# Patient Record
Sex: Male | Born: 1985 | Hispanic: Yes | Marital: Married | Smoking: Current every day smoker
Health system: Southern US, Community
[De-identification: ages and names within clinical notes are randomized; demographics above are authoritative.]

## PROBLEM LIST (undated history)

## (undated) DIAGNOSIS — N200 Calculus of kidney: Secondary | ICD-10-CM

## (undated) DIAGNOSIS — K219 Gastro-esophageal reflux disease without esophagitis: Secondary | ICD-10-CM

---

## 2013-12-17 ENCOUNTER — Encounter (HOSPITAL_COMMUNITY): Payer: Self-pay | Admitting: Emergency Medicine

## 2013-12-17 ENCOUNTER — Emergency Department (HOSPITAL_COMMUNITY)
Admission: EM | Admit: 2013-12-17 | Discharge: 2013-12-18 | Disposition: A | Discharge status: Home / Self Care | Payer: Self-pay | Attending: Emergency Medicine | Admitting: Emergency Medicine

## 2013-12-17 DIAGNOSIS — S0993XA Unspecified injury of face, initial encounter: Secondary | ICD-10-CM | POA: Insufficient documentation

## 2013-12-17 DIAGNOSIS — Z8719 Personal history of other diseases of the digestive system: Secondary | ICD-10-CM | POA: Insufficient documentation

## 2013-12-17 DIAGNOSIS — S199XXA Unspecified injury of neck, initial encounter: Secondary | ICD-10-CM

## 2013-12-17 DIAGNOSIS — Y9389 Activity, other specified: Secondary | ICD-10-CM | POA: Insufficient documentation

## 2013-12-17 DIAGNOSIS — Z79899 Other long term (current) drug therapy: Secondary | ICD-10-CM | POA: Insufficient documentation

## 2013-12-17 DIAGNOSIS — Z791 Long term (current) use of non-steroidal anti-inflammatories (NSAID): Secondary | ICD-10-CM | POA: Insufficient documentation

## 2013-12-17 DIAGNOSIS — R079 Chest pain, unspecified: Secondary | ICD-10-CM | POA: Insufficient documentation

## 2013-12-17 DIAGNOSIS — S139XXA Sprain of joints and ligaments of unspecified parts of neck, initial encounter: Secondary | ICD-10-CM | POA: Insufficient documentation

## 2013-12-17 DIAGNOSIS — S161XXA Strain of muscle, fascia and tendon at neck level, initial encounter: Secondary | ICD-10-CM

## 2013-12-17 DIAGNOSIS — Y9289 Other specified places as the place of occurrence of the external cause: Secondary | ICD-10-CM | POA: Insufficient documentation

## 2013-12-17 DIAGNOSIS — F17200 Nicotine dependence, unspecified, uncomplicated: Secondary | ICD-10-CM | POA: Insufficient documentation

## 2013-12-17 DIAGNOSIS — R110 Nausea: Secondary | ICD-10-CM | POA: Insufficient documentation

## 2013-12-17 DIAGNOSIS — X503XXA Overexertion from repetitive movements, initial encounter: Secondary | ICD-10-CM | POA: Insufficient documentation

## 2013-12-17 DIAGNOSIS — R51 Headache: Secondary | ICD-10-CM | POA: Insufficient documentation

## 2013-12-17 DIAGNOSIS — X500XXA Overexertion from strenuous movement or load, initial encounter: Secondary | ICD-10-CM | POA: Insufficient documentation

## 2013-12-17 HISTORY — DX: Gastro-esophageal reflux disease without esophagitis: K21.9

## 2013-12-17 MED ORDER — IBUPROFEN 800 MG PO TABS
800.0000 mg | ORAL_TABLET | Freq: Once | ORAL | Status: AC
  Administered 2013-12-17: 800 mg via ORAL
  Filled 2013-12-17: qty 1

## 2013-12-17 MED ORDER — IBUPROFEN 800 MG PO TABS: 800.0000 mg | ORAL_TABLET | Freq: Three times a day (TID) | ORAL | Status: DC

## 2013-12-17 MED ORDER — METHOCARBAMOL 500 MG PO TABS: 500.0000 mg | ORAL_TABLET | Freq: Two times a day (BID) | ORAL | Status: AC | PRN

## 2013-12-17 NOTE — ED Notes (Addendum)
Pt alert, NAD, calm, interactive, resps e/u, speaking in clear complete sentences. C/o neck pain (pinpoints to occiput), also CP and nausea, onset 1 hr ago, "feels better now" than before, rates pain 3/10, h/o similar, no aggravating factors, (denies: nvd, fever, cough, congestion, cold sx, fever, bleeding, dizziness or other sx), admits to 4 beers 1.5 hrs ago, no meds PTA, h/o "gastric issues". Family at [facility] x2. Friend translating for pt & pt's wife.

## 2013-12-17 NOTE — Discharge Instructions (Signed)
Please call your doctor for a followup appointment within 24-48 hours. When you talk to your doctor please let them know that you were seen in the emergency department and have them acquire all of your records so that they can discuss the findings with you and formulate a treatment plan to fully care for your new and ongoing problems. ° ° °Emergency Department Resource Guide °1) Find a Doctor and Pay Out of Pocket °Although you won't have to find out who is covered by your insurance plan, it is a good idea to ask around and get recommendations. You will then need to call the office and see if the doctor you have chosen will accept you as a new patient and what types of options they offer for patients who are self-pay. Some doctors offer discounts or will set up payment plans for their patients who do not have insurance, but you will need to ask so you aren't surprised when you get to your appointment. ° °2) Contact Your Local Health Department °Not all health departments have doctors that can see patients for sick visits, but many do, so it is worth a call to see if yours does. If you don't know where your local health department is, you can check in your phone book. The CDC also has a tool to help you locate your state's health department, and many state websites also have listings of all of their local health departments. ° °3) Find a Walk-in Clinic °If your illness is not likely to be very severe or complicated, you may want to try a walk in clinic. These are popping up all over the country in pharmacies, drugstores, and shopping centers. They're usually staffed by nurse practitioners or physician assistants that have been trained to treat common illnesses and complaints. They're usually fairly quick and inexpensive. However, if you have serious medical issues or chronic medical problems, these are probably not your best option. ° °No Primary Care Doctor: °- Call [facility] at  832-8000 - they can help you  locate a primary care doctor that  accepts your insurance, provides certain services, etc. °- Physician Referral Service- 1-800-533-3463 ° °Chronic Pain Problems: °Organization         Address  Phone   Notes  °[facility]  (336) 297-2271 Patients need to be referred by their primary care doctor.  ° °Medication Assistance: °Organization         Address  Phone   Notes  °[place] [facility] [address] °[place], [place] [address] (336) 641-8030 --Must be a resident of [place] °-- Must have NO insurance coverage whatsoever (no Medicaid/ Medicare, etc.) °-- The pt. MUST have a primary care doctor that directs their care regularly and follows them in the community °  °[facility]  (866) 331-1348   °[employer]  (888) 892-1162   ° °Agencies that provide inexpensive medical care: °Organization         Address  Phone   Notes  °[facility]  (336) 832-8035   °[facility]    (336) 832-7272   °[facility] [address] °[place], [place] [address] (336) 832-4777   °[facility] [address], °[place] (336) 271-4999   °[facility]    (336) 373-0678   °[facility]    (336) 272-1050   °[facility] ° [address], [place] Phone:  (336)   832-4444, Fax:  (336) 832-4440 Hours of Operation:  9 am - 6 pm, M-F.  Also accepts Medicaid/Medicare and self-pay.  °[facility] ° [address], [place] Phone: (336) 832-3150, Fax: (336) 832-3151. Hours of Operation:  8:30 am - 5:30 pm, M-F.  Also accepts Medicaid and self-pay.  °[facility] [place] [address], [place] Phone: (336) 878-6027   °[facility] [address], [place], [place] (336)723-1848, Ext. 123 Mondays & Thursdays: 7-9 AM.  First 15 patients are seen on a first come, first serve basis. °  ° °Medicaid-accepting [place]  Providers: ° °Organization         Address  Phone   Notes  °[facility] [address], [place] (336) 641-2100 Also accepts self-pay patients.  °[facility] [address], [place] ° (336) 856-9996   °[facility] [address], [place] (336) 288-8857   °[facility] [address] [place] [address], [place] (336) 299-7000   °Veita Bland [address], [place]  ° (336) 373-1557 Only accepts [place] Access Medicaid patients after they have their name applied to their card.  ° °Self-Pay (no insurance) in [place]: ° °Organization         Address  Phone   Notes  °Sickle Cell Patients, [facility] [address], [place] (336) 832-1970   °[facility] [address], [place] (336) 832-4400   °[facility] ° [address], [place] (336) 992-4800   °[facility]/Dr. Osei-Bonsu ° [address] [place] [address], [place] or [address], [place] (336) 841-8500 Phone number for both [place] and [place] locations is the same.  °[facility] [address], [place] (336) 299-0000   °[facility] [address] [place] [address], [place] or [address] (336) 852-7530 °(336) 878-2260   °[facility] [address], [place] (336) 350-1642, phone; (336) 294-5005, fax Sees patients 1st and 3rd Saturday of every month.  Must not qualify for public or private insurance (i.e. Medicaid, Medicare, [place] Health Choice, Veterans' Benefits) • Household income should be no more than 200% of the poverty level •The clinic cannot treat you if you are pregnant or think you are pregnant • Sexually transmitted diseases are not treated at the clinic.  ° ° °Dental Care: °Organization         Address  Phone  Notes  °[facility] [address], [place] (336) 641-6152 Accepts children up to age 21 who are enrolled in Medicaid or [place] Health Choice; pregnant women with a Medicaid card; and children who have applied for Medicaid or [place] Health Choice, but were declined, whose parents can pay a reduced fee at time of service.  °[facility] [place]  [address], [place] (336) 641-7733 Accepts children up to age 21 who are enrolled in Medicaid or [place] Health Choice; pregnant women with a Medicaid card; and children who have applied for Medicaid or [place] Health Choice, but were declined, whose parents can pay a reduced fee at time of service.  °[facility] ° [address],  (336) 641-4533 Patients are seen by appointment only. Walk-ins are   not accepted. [facility] will see patients 18 years of age and older. °Monday - Tuesday (8am-5pm) °Most Wednesdays (8:30-5pm) °$30 per visit, cash only  °[facility] ° [address], [place] (336) 641-4533 Patients are seen by appointment only. Walk-ins are not accepted. [facility] will see patients 18 years of age and older. °One Wednesday Evening (Monthly: Volunteer Based).  $30 per visit, cash only  °[facility]  (919) 537-3737 for adults; Children under age 4, call Graduate Pediatric Dentistry at (919) [phone]. Children aged 4-14, please call (919) [phone] to request a pediatric application. ° Dental services are provided in all areas of dental care including fillings, crowns and bridges, complete and partial dentures, implants, gum treatment, root canals, and extractions. Preventive care is also provided. Treatment is provided to both adults and children. °Patients are selected via a lottery and there is often a waiting list. °  °[facility] [address], °[place] ° (336) 763-8833 [email or web address] °  °[facility]  [address], [place], [place] (336)723-1848, Ext. 123 Second and Fourth Thursday of each month, opens at 6:30 AM; Clinic ends at 9 AM.  Patients are seen on a first-come first-served basis, and a limited number are seen during each clinic.  ° °[facility] ° [address], [place], [place] (336) 723-7904   Eligibility Requirements °You must have lived in [place], [place], or [place] counties for at least the last three months. °  You cannot be eligible for state or federal sponsored healthcare insurance, including Veterans Administration, Medicaid, or Medicare. °  You generally cannot be eligible for healthcare insurance through your employer.  °  How to apply: °Eligibility screenings are held every Tuesday and Wednesday afternoon from 1:00 pm until 4:00 pm. You do not need an appointment for the interview!  °[facility] [address], [place], [place] 336-631-2330   °[facility]  336-342-8273   °[place] [facility]  336-703-3100   °[facility]  336-570-6415   ° °Behavioral Health Resources in the Community: °Intensive Outpatient Programs °Organization         Address  Phone  Notes  °[facility] [address], [place], [place] 336-878-6098   °[facility] [address], [place], [place] 336-832-9800   °[facility] [address], [place], [place] ° 336-882-2125   °[facility] [address],  °[place], [place] 1-800-853-5163 or 336-641-4981   °Substance Abuse Resources °Organization         Address  Phone  Notes  °[facility]  336-882-2125   °[facility]  336-784-9470   °[facility]  336-285-9073   °[facility]  336-845-3988   °Residential & Outpatient Substance Abuse Program  1-800-659-3381   °Psychological Services °Organization         Address  Phone  Notes  °[facility]  336- 832-9600     °[facility]  336- 378-7881   °[facility] [address], [place] 1-800-853-5163 or 336-641-4981   ° °Mobile Crisis Teams °Organization         Address  Phone  Notes  °[facility], Mobile Crisis Care Unit  1-877-626-1772   °Assertive °[facility] ° [address] [place], [place] 336-834-9664   °Sharon DeEsch [address] °[place] 336-554-5454   ° °  Self-Help/Support Groups °Organization         Address  Phone             Notes  °[facility] [place] - variety of support groups  336- 373-1402 Call for more information  °Narcotics Anonymous (NA), [facility] [address], °[place]  2 meetings at this location  ° °Residential Treatment Programs °Organization         Address  Phone  Notes  °[facility] [address],    °[place]  1-866-801-8205   °[facility] ° [address], [place], [place] 704-293-8524   °[facility] [address], [place] 336-845-3988 Admissions: 8am-3pm M-F  °[facility] [address]    °[place], [place] 336-841-1104   °[facility] [address], [place], [place] 336-379-7146   °[facility] [address]  °[place], [place] 336-285-9073   °[facility] - Intensive Outpatient [address], [place], [place] 336-852-3033   °[facility] [address]  °[place], [place] 1-877-615-2722 or 336-784-9470   °[facility] [address] [place], [place] 336-227-7417 Accepts Medicaid  °[facility] [address]  °[place] 1-800-659-3381 Substance Abuse/Addiction Treatment  ° °[place] Behavioral Health Resources °Organization         Address  Phone  Notes  °[facility]  (888) 581-9988   °Julie Brannon, PhD [address] [place], [place]   (336) 349-5553 or (336) 951-0000    °[facility]   [address] °[place], [place] (336) 349-4454   °[facility] [address], [place], [place] (336) 342-8316 Insurance/Medicaid/sponsorship through [facility]  °[facility] [address]                                    [place], [place] (336) 342-8316 Therapy/tele-psych/case  °[facility] [address]  ° [place], [place] (336) 349-2233    °Dr. Arfeen  (336) 349-4544   °[facility] [place]  [employer] [place] [facility] 1) [address], [place] °2) [address], [place] °3)  [address], [place] (336) 349-3220 °(336) 342-7768 ° °(336) 342-8140   °[place] [facility] (336) 342-1394 or (336) 342-3537 (After Hours)    ° ° ° °

## 2013-12-17 NOTE — ED Provider Notes (Signed)
CSN: [id number]     Arrival date & time 12/17/13  2302 History   First MD Initiated Contact with Patient 12/17/13 2319     Chief Complaint  Patient presents with  . Headache  . Neck Pain  . Chest Pain  . Nausea     (Consider location/radiation/quality/duration/timing/severity/associated sxs/prior Treatment) HPI Comments: 28-year-old male, no past medical history, no past surgical history who presents with a complaint of neck pain. He states this started when he got home from work this evening. 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 He states that today was a particularly long day with lots of work, he feels the pain is in his posterior neck, posterior scalp and goes up to the crown of the head. There is no pain to the front of the head or the temporal areas and no pain to the sides of the neck. This is made worse with palpation of the back of the scalp and the neck. There is no associated numbness weakness or difficulty ambulating and he states that he has a very small amount of left-sided chest tightness which he says is only associated with his neck pain. There is no coughing, shortness of breath, fever, chills and has no difficulty ambulating. No problems with using his arms or legs, no difficulty with sensation. He has had no medication prior to arrival.  Patient is a 28 y.o. male presenting with headaches, neck pain, and chest pain. The history is provided by the patient and a relative.  Headache Associated symptoms: neck pain   Neck Pain Associated symptoms: chest pain and headaches   Chest Pain Associated symptoms: headache     Past Medical History  Diagnosis Date  . Acid reflux    History reviewed. No pertinent past surgical history. History reviewed. No pertinent family history. History  Substance Use Topics  . Smoking status: Current  Some Day Smoker  . Smokeless tobacco: Not on file  . Alcohol Use: Yes    Review of Systems  Cardiovascular: Positive for chest pain.  Musculoskeletal: Positive for neck pain.  Neurological: Positive for headaches.  All other systems reviewed and are negative.     Allergies  Review of patient's allergies indicates no known allergies.  Home Medications   Prior to Admission medications   Medication Sig Start Date End Date Taking? Authorizing Provider  ibuprofen (ADVIL,MOTRIN) 800 MG tablet Take 1 tablet (800 mg total) by mouth 3 (three) times daily. 12/17/13   Vida Roller, MD  methocarbamol (ROBAXIN) 500 MG tablet Take 1 tablet (500 mg total) by mouth 2 (two) times daily as needed for muscle spasms. 12/17/13   Vida Roller, MD   BP 115/74  Pulse 71  Temp(Src) 98.1 F (36.7 C) (Oral)  Resp 16  SpO2 100% Physical Exam  Nursing note and vitals reviewed. Constitutional: He appears well-developed and well-nourished. No distress.  HENT:  Head: Normocephalic and atraumatic.  Mouth/Throat: Oropharynx is clear and moist. No oropharyngeal exudate.  Eyes: Conjunctivae and EOM are normal. Pupils are equal, round, and reactive to light. Right eye exhibits no discharge. Left eye exhibits no discharge. No scleral icterus.  Neck: Normal range of motion. Neck supple. No JVD present. No thyromegaly present.  Cardiovascular: Normal rate, regular rhythm, normal heart sounds and intact distal pulses.  Exam reveals  no gallop and no friction rub.   No murmur heard. Pulmonary/Chest: Effort normal and breath sounds normal. No respiratory distress. He has no wheezes. He has no rales.  Abdominal: Soft. Bowel sounds are normal. He exhibits no distension and no mass. There is no tenderness.  Musculoskeletal: Normal range of motion. He exhibits tenderness. He exhibits no edema.  Tender to palpation in the posterior occiput and the posterior upper cervical neck, no bony tenderness of the cervical spine   Lymphadenopathy:    He has no cervical adenopathy.  Neurological: He is alert. Coordination normal.  Normal strength and sensation in all 4 extremities. Cranial nerves III through XII intact  Skin: Skin is warm and dry. No rash noted. No erythema.  Psychiatric: He has a normal mood and affect. His behavior is normal.    ED Course  Procedures (including critical care time) Labs Review Labs Reviewed - No data to display  Imaging Review No results found.   EKG Interpretation   Date/Time:  Sunday December 17 2013 23:04:24 EDT Ventricular Rate:  76 PR Interval:  162 QRS Duration: 92 QT Interval:  396 QTC Calculation: 445 R Axis:   86 Text Interpretation:  Normal sinus rhythm Nonspecific T wave abnormality  lead 3. ECG OTHERWISE WITHIN NORMAL LIMITS No old tracing to compare  Confirmed by [facility]  MD, Jacquilyn Seldon ([id number]) on 12/17/2013 11:20:38 PM      MDM   Final diagnoses:  Strain of neck muscle, initial encounter    The patient is well-appearing, the EKG is rather unremarkable, patient's story is not consistent with cardiac disease but rather more consistent with having a strain of his neck muscles. Medications given as below, stable for discharge.  Meds given in ED:  Medications  ibuprofen (ADVIL,MOTRIN) tablet 800 mg (not administered)    New Prescriptions   IBUPROFEN (ADVIL,MOTRIN) 800 MG TABLET    Take 1 tablet (800 mg total) by mouth 3 (three) times daily.   METHOCARBAMOL (ROBAXIN) 500 MG TABLET    Take 1 tablet (500 mg total) by mouth 2 (two) times daily as needed for muscle spasms.        Vida Roller, MD 12/17/13 [phone]

## 2013-12-17 NOTE — ED Notes (Signed)
Dr. Hyacinth Meeker EDP in to see pt.

## 2014-06-20 ENCOUNTER — Encounter (HOSPITAL_COMMUNITY): Payer: Self-pay | Admitting: *Deleted

## 2014-06-20 ENCOUNTER — Emergency Department (HOSPITAL_COMMUNITY)
Admission: EM | Admit: 2014-06-20 | Discharge: 2014-06-20 | Disposition: A | Discharge status: Home / Self Care | Payer: Self-pay | Attending: Emergency Medicine | Admitting: Emergency Medicine

## 2014-06-20 ENCOUNTER — Emergency Department (HOSPITAL_COMMUNITY): Payer: Self-pay

## 2014-06-20 DIAGNOSIS — R509 Fever, unspecified: Secondary | ICD-10-CM | POA: Insufficient documentation

## 2014-06-20 DIAGNOSIS — R109 Unspecified abdominal pain: Secondary | ICD-10-CM

## 2014-06-20 DIAGNOSIS — Z720 Tobacco use: Secondary | ICD-10-CM | POA: Insufficient documentation

## 2014-06-20 DIAGNOSIS — R1030 Lower abdominal pain, unspecified: Secondary | ICD-10-CM | POA: Insufficient documentation

## 2014-06-20 DIAGNOSIS — Z87442 Personal history of urinary calculi: Secondary | ICD-10-CM | POA: Insufficient documentation

## 2014-06-20 DIAGNOSIS — R51 Headache: Secondary | ICD-10-CM | POA: Insufficient documentation

## 2014-06-20 HISTORY — DX: Calculus of kidney: N20.0

## 2014-06-20 LAB — COMPREHENSIVE METABOLIC PANEL
ALK PHOS: 87 U/L (ref 39–117)
ALT: 39 U/L (ref 0–53)
AST: 34 U/L (ref 0–37)
Albumin: 4.3 g/dL (ref 3.5–5.2)
Anion gap: 3 — ABNORMAL LOW (ref 5–15)
BUN: 12 mg/dL (ref 6–23)
CO2: 26 mmol/L (ref 19–32)
Calcium: 8.7 mg/dL (ref 8.4–10.5)
Chloride: 108 mmol/L (ref 96–112)
Creatinine, Ser: 0.81 mg/dL (ref 0.50–1.35)
GLUCOSE: 103 mg/dL — AB (ref 70–99)
POTASSIUM: 3.5 mmol/L (ref 3.5–5.1)
Sodium: 137 mmol/L (ref 135–145)
Total Bilirubin: 0.7 mg/dL (ref 0.3–1.2)
Total Protein: 6.7 g/dL (ref 6.0–8.3)

## 2014-06-20 LAB — CBC WITH DIFFERENTIAL/PLATELET
BASOS ABS: 0 10*3/uL (ref 0.0–0.1)
Basophils Relative: 0 % (ref 0–1)
Eosinophils Absolute: 0.1 10*3/uL (ref 0.0–0.7)
Eosinophils Relative: 1 % (ref 0–5)
HCT: 37.3 % — ABNORMAL LOW (ref 39.0–52.0)
HEMOGLOBIN: 12.7 g/dL — AB (ref 13.0–17.0)
LYMPHS ABS: 1.7 10*3/uL (ref 0.7–4.0)
Lymphocytes Relative: 12 % (ref 12–46)
MCH: 27.9 pg (ref 26.0–34.0)
MCHC: 34 g/dL (ref 30.0–36.0)
MCV: 82 fL (ref 78.0–100.0)
MONOS PCT: 9 % (ref 3–12)
Monocytes Absolute: 1.4 10*3/uL — ABNORMAL HIGH (ref 0.1–1.0)
Neutro Abs: 11.5 10*3/uL — ABNORMAL HIGH (ref 1.7–7.7)
Neutrophils Relative %: 78 % — ABNORMAL HIGH (ref 43–77)
Platelets: 177 10*3/uL (ref 150–400)
RBC: 4.55 MIL/uL (ref 4.22–5.81)
RDW: 13.6 % (ref 11.5–15.5)
WBC: 14.7 10*3/uL — ABNORMAL HIGH (ref 4.0–10.5)

## 2014-06-20 LAB — URINALYSIS, ROUTINE W REFLEX MICROSCOPIC
BILIRUBIN URINE: NEGATIVE
Glucose, UA: NEGATIVE mg/dL
HGB URINE DIPSTICK: NEGATIVE
KETONES UR: NEGATIVE mg/dL
Leukocytes, UA: NEGATIVE
Nitrite: NEGATIVE
PH: 5.5 (ref 5.0–8.0)
Protein, ur: NEGATIVE mg/dL
SPECIFIC GRAVITY, URINE: 1.03 (ref 1.005–1.030)
Urobilinogen, UA: 0.2 mg/dL (ref 0.0–1.0)

## 2014-06-20 MED ORDER — ONDANSETRON HCL 4 MG/2ML IJ SOLN: 4.0000 mg | Freq: Once | INTRAMUSCULAR | Status: DC

## 2014-06-20 MED ORDER — OXYCODONE-ACETAMINOPHEN 5-325 MG PO TABS
1.0000 | ORAL_TABLET | Freq: Once | ORAL | Status: AC
  Administered 2014-06-20: 1 via ORAL
  Filled 2014-06-20: qty 1

## 2014-06-20 MED ORDER — MORPHINE SULFATE 4 MG/ML IJ SOLN: 4.0000 mg | Freq: Once | INTRAMUSCULAR | Status: DC

## 2014-06-20 MED ORDER — NAPROXEN 500 MG PO TABS: 500.0000 mg | ORAL_TABLET | Freq: Two times a day (BID) | ORAL | Status: DC

## 2014-06-20 MED ORDER — KETOROLAC TROMETHAMINE 30 MG/ML IJ SOLN: 30.0000 mg | Freq: Once | INTRAMUSCULAR | Status: DC

## 2014-06-20 NOTE — ED Notes (Signed)
Pt reports lower right flank pain for a few days with no urinary symptoms.  Pt has hx of kidney stones. Pt alert and oriented.

## 2014-06-20 NOTE — Discharge Instructions (Signed)
Su diagnstico diferencial de hoy no mostr una causa de su dolor unilateral derecha. No hay infecciones, clculos renales, o cualquier otra anomala se observaron en el TAC. Su orina y el trabajo de laboratorio tambin fue normal. Tomar la medicacin segn sea necesario para Chief Technology Officerel dolor. Dolor en el flanco  (Flank Pain) El dolor en el flanco se refiere a aquel dolor que se localiza en un lado del cuerpo, entre la zona superior del abdomen y Scientific laboratory technicianla espalda. Puede aparecer en un perodo corto de tiempo (agudo) o puede durar mucho tiempo o ser recurrente (crnico). Puede ser leve o intenso. Puede tener diferentes causas.  CAUSAS  Algunas de las causas ms comunes son:   Esguince muscular.   Espasmos musculares.   Problemas en la columna vertebral (enfermedad de los discos).   Infeccin en el pulmn (neumona).   Lquido en los pulmones (edema pulmonar).   Infeccin renal.   Piedras en el rin.   Una erupcin cutnea muy dolorosa causada por el virus de la varicela (herpes zoster).  Enfermedad de la vescula biliar. INSTRUCCIONES PARA EL CUIDADO EN EL HOGAR  Los cuidados en el hogar dependern de la causa del dolor. En general:   Haga reposo segn las indicaciones del mdico.  Debe ingerir gran cantidad de lquido para mantener la orina de tono claro o color amarillo plido.  Tome slo medicamentos de venta libre o recetados, segn las indicaciones del mdico. Algunos medicamentos ayudan a Engineer, materialsaliviar el dolor.  Informe al mdico si tiene algn Arboriculturistcambio en el dolor.  Concurra a las visitas de control, segn las indicaciones. SOLICITE ATENCIN MDICA DE INMEDIATO SI:   El dolor no se alivia con los United Parcelmedicamentos.   Tiene sntomas nuevos o que empeoran.  El dolor Iredellaumenta.   Siente dolor abdominal.   Le falta el aire.   Tiene nuseas o vmitos persistentes.   El abdomen se hincha.   Sufre mareos o se desmaya.   Observa sangre en la orina.  Tiene fiebre o sntomas  persistentes durante ms de 2  3 das.  Tiene fiebre y los sntomas empeoran repentinamente. ASEGRESE DE QUE:   Comprende estas instrucciones.  Controlar su enfermedad.  Solicitar ayuda de inmediato si no mejora o si empeora. Document Released: 07/14/2007 Document Revised: 12/30/2011 [facility] Patient Information 2015 [place], [place]. This information is not intended to replace advice given to you by your health care provider. Make sure you discuss any questions you have with your health care provider.

## 2014-06-20 NOTE — ED Notes (Signed)
Patient presents with c/o right flank pain, decreased urination

## 2014-06-20 NOTE — ED Provider Notes (Signed)
CSN: [id number]     Arrival date & time 06/20/14  0002 History  This chart was scribed for Olivia Mackielga M Yaasir Menken, MD by Annye AsaAnna Dorsett, ED Scribe. This patient was seen in room A06C/A06C and the patient's care was started at 12:51 AM.    Chief Complaint  Patient presents with  . Flank Pain   Patient is a 29 y.o. male presenting with flank pain. The history is provided by the patient. No language interpreter was used.  Flank Pain Associated symptoms include headaches.     HPI Comments: Drew Austin is a 29 y.o. male who presents to the Emergency Department complaining of 1 day of waxing and waning right-sided flank pain, currently rated 6/10. This pain began suddenly earlier tonight; it is described as "sharp." He also notes subjective fever and headache. He denies hematuria, nausea. He took two Advil for pain with mild relief. Patient reports prior experience with similar symptoms; he explains he had kidney stones at that time (last episode 5 years PTA, no prior related surgeries).   Past Medical History  Diagnosis Date  . Kidney stone    History reviewed. No pertinent past surgical history. No family history on file. History  Substance Use Topics  . Smoking status: Current Every Day Smoker  . Smokeless tobacco: Never Used  . Alcohol Use: No    Review of Systems  Constitutional: Positive for fever. Fatigue: Subjective.  Gastrointestinal: Negative for nausea.  Genitourinary: Positive for flank pain. Negative for hematuria.  Neurological: Positive for headaches.  All other systems reviewed and are negative.  Allergies  Review of patient's allergies indicates not on file.  Home Medications   Prior to Admission medications   Not on File   BP 108/70 mmHg  Pulse 91  Temp(Src) 98.7 F (37.1 C) (Oral)  Resp 20  Ht 5\' 10"  (1.778 m)  Wt 178 lb 6.4 oz (80.922 kg)  BMI 25.60 kg/m2  SpO2 96% Physical Exam  Constitutional: He is oriented to person, place, and time. He  appears well-developed and well-nourished. No distress.  HENT:  Head: Normocephalic and atraumatic.  Mouth/Throat: Oropharynx is clear and moist. No oropharyngeal exudate.  Eyes: EOM are normal. Pupils are equal, round, and reactive to light.  Neck: Normal range of motion. Neck supple.  Cardiovascular: Normal rate, regular rhythm and normal heart sounds.  Exam reveals no gallop and no friction rub.   No murmur heard. Pulmonary/Chest: Effort normal. No respiratory distress. He has no wheezes. He has no rales.  Abdominal: Soft. There is no tenderness. There is no rebound and no guarding.  Musculoskeletal: Normal range of motion. He exhibits no edema.  Neurological: He is alert and oriented to person, place, and time.  Skin: Skin is warm and dry. No rash noted.  Psychiatric: He has a normal mood and affect. His behavior is normal.  Nursing note and vitals reviewed.   ED Course  Procedures   DIAGNOSTIC STUDIES: Oxygen Saturation is 96% on RA, adequate by my interpretation.    COORDINATION OF CARE: 12:56 AM Discussed treatment plan with pt at bedside and pt agreed to plan.   Labs Review Labs Reviewed  URINALYSIS, ROUTINE W REFLEX MICROSCOPIC - Abnormal; Notable for the following:    APPearance CLOUDY (*)    All other components within normal limits  CBC WITH DIFFERENTIAL/PLATELET - Abnormal; Notable for the following:    WBC 14.7 (*)    Hemoglobin 12.7 (*)    HCT 37.3 (*)  Neutrophils Relative % 78 (*)    Neutro Abs 11.5 (*)    Monocytes Absolute 1.4 (*)    All other components within normal limits  COMPREHENSIVE METABOLIC PANEL - Abnormal; Notable for the following:    Glucose, Bld 103 (*)    Anion gap 3 (*)    All other components within normal limits    Imaging Review Ct Renal Stone Study  06/20/2014   CLINICAL DATA:  Right-sided flank pain and right lower quadrant pain since yesterday  EXAM: CT ABDOMEN AND PELVIS WITHOUT CONTRAST  TECHNIQUE: Multidetector CT imaging of  the abdomen and pelvis was performed following the standard protocol without IV contrast.  COMPARISON:  None.  FINDINGS: BODY WALL: Unremarkable.  LOWER CHEST: Unremarkable.  ABDOMEN/PELVIS:  Liver: No focal abnormality.  Biliary: No evidence of biliary obstruction or stone.  Pancreas: Unremarkable.  Spleen: Unremarkable.  Adrenals: Unremarkable.  Kidneys and ureters: No hydronephrosis or stone.  Bladder: Unremarkable.  Reproductive: Unremarkable.  Bowel: No obstruction. Normal appendix.  Retroperitoneum: Small calcified lymph nodes in the retroperitoneum or mesentery compatible with remote granulomatous disease.  Peritoneum: No ascites or pneumoperitoneum.  Vascular: No acute abnormality.  OSSEOUS: No acute abnormalities.  IMPRESSION: No explanation for acute pain.  No urolithiasis or appendicitis.   Electronically Signed   By: Marnee Spring M.D.   On: 06/20/2014 02:59     EKG Interpretation None      MDM   Final diagnoses:  Right flank pain   29 year old male with right flank pain throughout the day, history of kidney stone 5 years ago.  Normal exam.  Suspect nephrolithiasis.  Patient requesting not to have strong pain medicine.  We'll check urinalysis and CT  I personally performed the services described in this documentation, which was scribed in my presence. The recorded information has been reviewed and is accurate.   Patient without nephrolithiasis.  He now reports that he has been working the last 2 weeks straight without a break.  He works as a Education administrator.  Pain may be musculoskeletal in origin.  He is comfortable with discharge home.    Olivia Mackie, MD 06/20/14 [phone]

## 2014-07-14 ENCOUNTER — Encounter (HOSPITAL_COMMUNITY): Payer: Self-pay | Admitting: *Deleted

## 2014-07-14 ENCOUNTER — Emergency Department (HOSPITAL_COMMUNITY)
Admission: EM | Admit: 2014-07-14 | Discharge: 2014-07-15 | Disposition: A | Discharge status: Home / Self Care | Payer: Self-pay | Attending: Emergency Medicine | Admitting: Emergency Medicine

## 2014-07-14 DIAGNOSIS — Z720 Tobacco use: Secondary | ICD-10-CM | POA: Insufficient documentation

## 2014-07-14 DIAGNOSIS — J069 Acute upper respiratory infection, unspecified: Secondary | ICD-10-CM | POA: Insufficient documentation

## 2014-07-14 DIAGNOSIS — B9789 Other viral agents as the cause of diseases classified elsewhere: Secondary | ICD-10-CM

## 2014-07-14 DIAGNOSIS — R000 Tachycardia, unspecified: Secondary | ICD-10-CM | POA: Insufficient documentation

## 2014-07-14 DIAGNOSIS — Z87442 Personal history of urinary calculi: Secondary | ICD-10-CM | POA: Insufficient documentation

## 2014-07-14 DIAGNOSIS — J988 Other specified respiratory disorders: Secondary | ICD-10-CM

## 2014-07-14 MED ORDER — SODIUM CHLORIDE 0.9 % IV BOLUS (SEPSIS)
1000.0000 mL | Freq: Once | INTRAVENOUS | Status: AC
  Administered 2014-07-15: 1000 mL via INTRAVENOUS

## 2014-07-14 MED ORDER — KETOROLAC TROMETHAMINE 30 MG/ML IJ SOLN
30.0000 mg | Freq: Once | INTRAMUSCULAR | Status: AC
  Administered 2014-07-15: 30 mg via INTRAVENOUS
  Filled 2014-07-14: qty 1

## 2014-07-14 MED ORDER — DIPHENHYDRAMINE HCL 50 MG/ML IJ SOLN
25.0000 mg | Freq: Once | INTRAMUSCULAR | Status: AC
  Administered 2014-07-15: 25 mg via INTRAVENOUS
  Filled 2014-07-14: qty 1

## 2014-07-14 MED ORDER — METOCLOPRAMIDE HCL 5 MG/ML IJ SOLN
10.0000 mg | Freq: Once | INTRAMUSCULAR | Status: AC
  Administered 2014-07-15: 10 mg via INTRAVENOUS
  Filled 2014-07-14: qty 2

## 2014-07-14 NOTE — ED Notes (Signed)
The pt is c/o a headache since yesterday with a fever.  No n or v.. He has a history of headaches.  C/o being cold

## 2014-07-14 NOTE — ED Provider Notes (Signed)
CSN: [id number]     Arrival date & time 07/14/14  2115 History  This chart was scribed for Tomasita CrumbleAdeleke Katrisha Segall, MD by Tanda RockersMargaux Venter, ED Scribe. This patient was seen in room D32C/D32C and the patient's care was started at 11:36 PM.    Chief Complaint  Patient presents with  . Headache   The history is provided by the patient. The history is limited by a language barrier. A language interpreter was used.    HPI Comments: Drew Austin is a 29 y.o. male who presents to the Emergency Department complaining of a headache that began 1 day ago. Pt also complains of fever, chills and myalgias. He states that he has been having mild numbness in his left hand as well. Pt reports that he has had similar symptoms approximately 1 month ago and was seen in the ED. Pt mentions that he has had recent sick contact with son who had similar symptoms. Pt admits to taking Advil with mild relief. He denies nasal congestion, rhinorrhea, cough, seasonal allergies, nausea, vomiting, or any other symptoms.   Past Medical History  Diagnosis Date  . Kidney stone    History reviewed. No pertinent past surgical history. No family history on file. History  Substance Use Topics  . Smoking status: Current Every Day Smoker  . Smokeless tobacco: Never Used  . Alcohol Use: No    Review of Systems  10 Systems reviewed and all are negative for acute change except as noted in the HPI.    Allergies  Review of patient's allergies indicates no known allergies.  Home Medications   Prior to Admission medications   Medication Sig Start Date End Date Taking? Authorizing Provider  ibuprofen (ADVIL,MOTRIN) 200 MG tablet Take 200 mg by mouth every 6 (six) hours as needed for mild pain.   Yes Historical Provider, MD  naproxen (NAPROSYN) 500 MG tablet Take 1 tablet (500 mg total) by mouth 2 (two) times daily. Patient not taking: Reported on 07/14/2014 06/20/14   Marisa Severinlga Otter, MD   Triage Vitals: BP 119/68 mmHg  Pulse  106  Temp(Src) 99 F (37.2 C)  Resp 16  SpO2 96%   Physical Exam  Constitutional: He is oriented to person, place, and time. Vital signs are normal. He appears well-developed and well-nourished.  Non-toxic appearance. He does not appear ill. No distress.  Tactile fever.   HENT:  Head: Normocephalic and atraumatic.  Nose: Nose normal.  Mouth/Throat: Oropharynx is clear and moist. No oropharyngeal exudate.  Eyes: Conjunctivae and EOM are normal. Pupils are equal, round, and reactive to light. No scleral icterus.  Neck: Normal range of motion. Neck supple. No tracheal deviation, no edema, no erythema and normal range of motion present. No thyroid mass and no thyromegaly present.  Cardiovascular: Regular rhythm, S1 normal, S2 normal, normal heart sounds, intact distal pulses and normal pulses.  Tachycardia present.  Exam reveals no gallop and no friction rub.   No murmur heard. Pulses:      Radial pulses are 2+ on the right side, and 2+ on the left side.       Dorsalis pedis pulses are 2+ on the right side, and 2+ on the left side.  Pulmonary/Chest: Effort normal and breath sounds normal. No respiratory distress. He has no wheezes. He has no rhonchi. He has no rales.  Abdominal: Soft. Normal appearance and bowel sounds are normal. He exhibits no distension, no ascites and no mass. There is no hepatosplenomegaly. There is no tenderness.  There is no rebound, no guarding and no CVA tenderness.  Musculoskeletal: Normal range of motion. He exhibits no edema or tenderness.  Lymphadenopathy:    He has no cervical adenopathy.  Neurological: He is alert and oriented to person, place, and time. He has normal strength. No cranial nerve deficit or sensory deficit.  Normal strength and sensation in all 4 extremities.   Skin: Skin is warm, dry and intact. No petechiae and no rash noted. He is not diaphoretic. No erythema. No pallor.  Psychiatric: He has a normal mood and affect. His behavior is normal.  Judgment normal.  Nursing note and vitals reviewed.   ED Course  Procedures (including critical care time)  DIAGNOSTIC STUDIES: Oxygen Saturation is 96% on RA, normal by my interpretation.    COORDINATION OF CARE: 11:42 PM-Discussed treatment plan which includes IV fluids with pt at bedside and pt agreed to plan.   Labs Review Labs Reviewed  CBC WITH DIFFERENTIAL/PLATELET - Abnormal; Notable for the following:    WBC 14.8 (*)    Hemoglobin 12.9 (*)    HCT 37.7 (*)    Neutrophils Relative % 82 (*)    Neutro Abs 12.1 (*)    Lymphocytes Relative 9 (*)    Monocytes Absolute 1.4 (*)    All other components within normal limits  BASIC METABOLIC PANEL - Abnormal; Notable for the following:    Sodium 131 (*)    Glucose, Bld 120 (*)    All other components within normal limits  I-STAT CG4 LACTIC ACID, ED    Imaging Review No results found.   EKG Interpretation None      MDM   Final diagnoses:  None   Patient presents to the emergency department for headache, upper respirations symptoms, fever. Patient also had diffuse body aches. He has sick contacts and his son with similar symptoms. He states this occurred to him one month ago previously as well. He was given Toradol, Reglan, Benadryl the emergency department. Upon repeat evaluation patient states his headache has improved. We'll discharge with prescription for Motrin and Reglan. Return precautions given. Patient is advised to follow-up with a primary care physician within 3 days. His vital signs remain within his normal limits and he is safe for discharge.   I personally performed the services described in this documentation, which was scribed in my presence. The recorded information has been reviewed and is accurate.      Tomasita Crumble, MD 07/15/14 [phone]

## 2014-07-15 LAB — CBC WITH DIFFERENTIAL/PLATELET
BASOS ABS: 0 10*3/uL (ref 0.0–0.1)
Basophils Relative: 0 % (ref 0–1)
Eosinophils Absolute: 0 10*3/uL (ref 0.0–0.7)
Eosinophils Relative: 0 % (ref 0–5)
HCT: 37.7 % — ABNORMAL LOW (ref 39.0–52.0)
Hemoglobin: 12.9 g/dL — ABNORMAL LOW (ref 13.0–17.0)
Lymphocytes Relative: 9 % — ABNORMAL LOW (ref 12–46)
Lymphs Abs: 1.3 10*3/uL (ref 0.7–4.0)
MCH: 28.1 pg (ref 26.0–34.0)
MCHC: 34.2 g/dL (ref 30.0–36.0)
MCV: 82.1 fL (ref 78.0–100.0)
Monocytes Absolute: 1.4 10*3/uL — ABNORMAL HIGH (ref 0.1–1.0)
Monocytes Relative: 9 % (ref 3–12)
NEUTROS PCT: 82 % — AB (ref 43–77)
Neutro Abs: 12.1 10*3/uL — ABNORMAL HIGH (ref 1.7–7.7)
Platelets: 157 10*3/uL (ref 150–400)
RBC: 4.59 MIL/uL (ref 4.22–5.81)
RDW: 13.6 % (ref 11.5–15.5)
WBC: 14.8 10*3/uL — AB (ref 4.0–10.5)

## 2014-07-15 LAB — BASIC METABOLIC PANEL
Anion gap: 10 (ref 5–15)
BUN: 12 mg/dL (ref 6–23)
CO2: 21 mmol/L (ref 19–32)
Calcium: 8.8 mg/dL (ref 8.4–10.5)
Chloride: 100 mmol/L (ref 96–112)
Creatinine, Ser: 0.83 mg/dL (ref 0.50–1.35)
GFR calc Af Amer: >90 mL/min (ref >90)
GFR calc non Af Amer: >90 mL/min (ref >90)
Glucose, Bld: 120 mg/dL — ABNORMAL HIGH (ref 70–99)
Potassium: 3.5 mmol/L (ref 3.5–5.1)
SODIUM: 131 mmol/L — AB (ref 135–145)

## 2014-07-15 LAB — I-STAT CG4 LACTIC ACID, ED: Lactic Acid, Venous: 0.98 mmol/L (ref 0.5–2.0)

## 2014-07-15 MED ORDER — IBUPROFEN 800 MG PO TABS: 800.0000 mg | ORAL_TABLET | Freq: Three times a day (TID) | ORAL | Status: DC | PRN

## 2014-07-15 MED ORDER — ACETAMINOPHEN 325 MG PO TABS
650.0000 mg | ORAL_TABLET | Freq: Once | ORAL | Status: AC
  Administered 2014-07-15: 650 mg via ORAL
  Filled 2014-07-15: qty 2

## 2014-07-15 MED ORDER — METOCLOPRAMIDE HCL 10 MG PO TABS: 10.0000 mg | ORAL_TABLET | Freq: Three times a day (TID) | ORAL | Status: DC | PRN

## 2014-07-15 NOTE — Discharge Instructions (Signed)
Viral Infections   El Sr. Drew Austin , tomar la medicacin prescrita para el dolor de Turkmenistancabeza y Patent attorneyver a un mdico de atencin primaria dentro de los 3 das de seguimiento. Si los sntomas empeoran cualquier vuelven a departamento de emergencias. Gracias.  Mr. Drew Austin, take medication as prescribed for your headache and see a primary care physician within 3 days for follow-up. If any symptoms worsen come back to emergency department immediately. Thank you. A virus is a type of germ. Viruses can cause:  Minor sore throats.  Aches and pains.  Headaches.  Runny nose.  Rashes.  Watery eyes.  Tiredness.  Coughs.  Loss of appetite.  Feeling sick to your stomach (nausea).  Throwing up (vomiting).  Watery poop (diarrhea). HOME CARE   Only take medicines as told by your doctor.  Drink enough water and fluids to keep your pee (urine) clear or pale yellow. Sports drinks are a good choice.  Get plenty of rest and eat healthy. Soups and broths with crackers or rice are fine. GET HELP RIGHT AWAY IF:   You have a very bad headache.  You have shortness of breath.  You have chest pain or neck pain.  You have an unusual rash.  You cannot stop throwing up.  You have watery poop that does not stop.  You cannot keep fluids down.  You or your child has a temperature by mouth above 102 F (38.9 C), not controlled by medicine.  Your baby is older than 3 months with a rectal temperature of 102 F (38.9 C) or higher.  Your baby is 523 months old or younger with a rectal temperature of 100.4 F (38 C) or higher. MAKE SURE YOU:   Understand these instructions.  Will watch this condition.  Will get help right away if you are not doing well or get worse. Document Released: 03/19/2008 Document Revised: 06/29/2011 Document Reviewed: 08/12/2010 [facility] Patient Information 2015 [place], [place]. This information is not intended to replace advice given to you by your health  care provider. Make sure you discuss any questions you have with your health care provider.

## 2015-03-22 ENCOUNTER — Encounter (HOSPITAL_COMMUNITY): Payer: Self-pay | Admitting: Emergency Medicine

## 2015-03-22 ENCOUNTER — Emergency Department (HOSPITAL_COMMUNITY)
Admission: EM | Admit: 2015-03-22 | Discharge: 2015-03-23 | Disposition: A | Discharge status: Home / Self Care | Payer: Self-pay | Attending: Emergency Medicine | Admitting: Emergency Medicine

## 2015-03-22 DIAGNOSIS — R1013 Epigastric pain: Secondary | ICD-10-CM

## 2015-03-22 DIAGNOSIS — Z87442 Personal history of urinary calculi: Secondary | ICD-10-CM | POA: Insufficient documentation

## 2015-03-22 DIAGNOSIS — K8050 Calculus of bile duct without cholangitis or cholecystitis without obstruction: Secondary | ICD-10-CM

## 2015-03-22 DIAGNOSIS — F17210 Nicotine dependence, cigarettes, uncomplicated: Secondary | ICD-10-CM | POA: Insufficient documentation

## 2015-03-22 DIAGNOSIS — K8020 Calculus of gallbladder without cholecystitis without obstruction: Secondary | ICD-10-CM | POA: Insufficient documentation

## 2015-03-22 LAB — URINALYSIS, ROUTINE W REFLEX MICROSCOPIC
BILIRUBIN URINE: NEGATIVE
Glucose, UA: NEGATIVE mg/dL
Hgb urine dipstick: NEGATIVE
KETONES UR: NEGATIVE mg/dL
Leukocytes, UA: NEGATIVE
NITRITE: NEGATIVE
PH: 7.5 (ref 5.0–8.0)
PROTEIN: NEGATIVE mg/dL
Specific Gravity, Urine: 1.021 (ref 1.005–1.030)

## 2015-03-22 LAB — COMPREHENSIVE METABOLIC PANEL
ALBUMIN: 4.3 g/dL (ref 3.5–5.0)
ALK PHOS: 80 U/L (ref 38–126)
ALT: 73 U/L — AB (ref 17–63)
AST: 45 U/L — AB (ref 15–41)
Anion gap: 9 (ref 5–15)
BILIRUBIN TOTAL: 0.8 mg/dL (ref 0.3–1.2)
BUN: 9 mg/dL (ref 6–20)
CO2: 26 mmol/L (ref 22–32)
CREATININE: 0.76 mg/dL (ref 0.61–1.24)
Calcium: 9.1 mg/dL (ref 8.9–10.3)
Chloride: 106 mmol/L (ref 101–111)
GFR calc Af Amer: >60 mL/min (ref >60)
GLUCOSE: 101 mg/dL — AB (ref 65–99)
POTASSIUM: 3.6 mmol/L (ref 3.5–5.1)
Sodium: 141 mmol/L (ref 135–145)
TOTAL PROTEIN: 6.8 g/dL (ref 6.5–8.1)

## 2015-03-22 LAB — CBC
HCT: 42.4 % (ref 39.0–52.0)
Hemoglobin: 14 g/dL (ref 13.0–17.0)
MCH: 27.8 pg (ref 26.0–34.0)
MCHC: 33 g/dL (ref 30.0–36.0)
MCV: 84.3 fL (ref 78.0–100.0)
PLATELETS: 172 10*3/uL (ref 150–400)
RBC: 5.03 MIL/uL (ref 4.22–5.81)
RDW: 13.1 % (ref 11.5–15.5)
WBC: 9.3 10*3/uL (ref 4.0–10.5)

## 2015-03-22 LAB — LIPASE, BLOOD: Lipase: 28 U/L (ref 11–51)

## 2015-03-22 MED ORDER — PANTOPRAZOLE SODIUM 40 MG PO TBEC
40.0000 mg | DELAYED_RELEASE_TABLET | Freq: Once | ORAL | Status: AC
  Administered 2015-03-23: 40 mg via ORAL
  Filled 2015-03-22: qty 1

## 2015-03-22 MED ORDER — ONDANSETRON 4 MG PO TBDP
4.0000 mg | ORAL_TABLET | Freq: Once | ORAL | Status: AC
  Administered 2015-03-23: 4 mg via ORAL
  Filled 2015-03-22: qty 1

## 2015-03-22 NOTE — ED Provider Notes (Signed)
CSN: [id number]     Arrival date & time 03/22/15  2110 History   First MD Initiated Contact with Patient 03/22/15 2259     Chief Complaint  Patient presents with  . Abdominal Pain     (Consider location/radiation/quality/duration/timing/severity/associated sxs/prior Treatment) Patient is a 29 y.o. male presenting with abdominal pain. The history is provided by the patient and medical records. Language interpreter used: Daughter at bedside helping with translation.  Abdominal Pain Associated symptoms: nausea and vomiting   Associated symptoms: no constipation, no cough, no diarrhea, no shortness of breath and no sore throat    Drew Austin is a 29 y.o. male  with a PMH of kidney stones who presents to the Emergency Department complaining of crampy, intermittent epigastric abdominal pain x 1 week. One episode of emesis today. Admits to belching more than normal. No fever/chills, no diarrhea. No sick contacts.    Past Medical History  Diagnosis Date  . Kidney stone    History reviewed. No pertinent past surgical history. No family history on file. Social History  Substance Use Topics  . Smoking status: Current Every Day Smoker  . Smokeless tobacco: Never Used  . Alcohol Use: No    Review of Systems  Constitutional: Negative.   HENT: Negative for congestion, rhinorrhea and sore throat.   Eyes: Negative for visual disturbance.  Respiratory: Negative for cough, shortness of breath and wheezing.   Cardiovascular: Negative.   Gastrointestinal: Positive for nausea, vomiting and abdominal pain. Negative for diarrhea and constipation.  Genitourinary: Negative for difficulty urinating.  Musculoskeletal: Negative for myalgias, back pain, arthralgias and neck pain.  Skin: Negative for rash.  Neurological: Negative for dizziness, weakness and headaches.      Allergies  Review of patient's allergies indicates no known allergies.  Home Medications   Prior to  Admission medications   Medication Sig Start Date End Date Taking? Authorizing Provider  ketorolac (TORADOL) 10 MG tablet Take 1 tablet (10 mg total) by mouth every 6 (six) hours as needed. 03/23/15   Jaime Pilcher Ward, PA-C  ondansetron (ZOFRAN) 4 MG tablet Take 1 tablet (4 mg total) by mouth every 8 (eight) hours as needed for nausea or vomiting. 03/23/15   Jaime Pilcher Ward, PA-C   BP 119/80 mmHg  Pulse 72  Temp(Src) 97.6 F (36.4 C) (Oral)  Resp 18  Wt 85.73 kg  SpO2 99% Physical Exam  Constitutional: He is oriented to person, place, and time. He appears well-developed and well-nourished.  Alert and in no acute distress  HENT:  Head: Normocephalic and atraumatic.  Cardiovascular: Normal rate, regular rhythm, normal heart sounds and intact distal pulses.  Exam reveals no gallop and no friction rub.   No murmur heard. Pulmonary/Chest: Effort normal and breath sounds normal. No respiratory distress. He has no wheezes. He has no rales. He exhibits no tenderness.  Abdominal: He exhibits no mass. There is no rebound and no guarding.    Abdomen soft, non-distended Bowel sounds positive in all four quadrants  Musculoskeletal: He exhibits no edema.  Neurological: He is alert and oriented to person, place, and time.  Skin: Skin is warm and dry. No rash noted.  Cap refill < 3 seconds.   Psychiatric: He has a normal mood and affect. His behavior is normal. Judgment and thought content normal.  Nursing note and vitals reviewed.   ED Course  Procedures (including critical care time) Labs Review Labs Reviewed  COMPREHENSIVE METABOLIC PANEL - Abnormal; Notable for the following:  Glucose, Bld 101 (*)    AST 45 (*)    ALT 73 (*)    All other components within normal limits  LIPASE, BLOOD  CBC  URINALYSIS, ROUTINE W REFLEX MICROSCOPIC (NOT AT [facility])    Imaging Review US Abdomen Limited Ruq  03/23/2015  CLINICAL DATA:  29 year old male with epigastric pain. EXAM: US ABDOMEN LIMITED  - RIGHT UPPER QUADRANT COMPARISON:  CT dated 06/20/2014 FINDINGS: Gallbladder: Small amount of sludge may be present within the gallbladder. There is no stone, gallbladder wall thickening or pericholecystic fluid. Negative sonographic Murphy's sign. Common bile duct: Diameter: 2.5 mm Liver: Diffuse hepatic steatosis with focal areas of fatty sparing adjacent to the gallbladder fossa. IMPRESSION: Fatty liver. No gallstone or sonographic evidence of acute cholecystitis. Electronically Signed   By: Elgie Collard M.D.   On: 03/23/2015 01:42   I have personally reviewed and evaluated these images and lab results as part of my medical decision-making.   EKG Interpretation None      MDM   Final diagnoses:  Epigastric pain  Biliary colic   [facility] presents with epigastric abdominal pain likely gerd vs. Gastritis. Protonix and zofran given here.  Labs show mild bump in AST/ALT - will order RUQ U/S to rule out biliary etiology   U/S shows fatty liver and gallbladder sludge -- will give GI and gen surg referral, along with NSAIDs for pain mgt and zofran for nausea  Patient discussed with Dr. Patria Mane who agrees with treatment plan.   [facility] Ward, PA-C 03/23/15 0221  Azalia Bilis, MD 03/23/15 [phone]

## 2015-03-22 NOTE — ED Notes (Addendum)
Pt. reports mid abdominal pain with emesis x1 and belching onset this week  , denies diarrhea , no fever or chills.

## 2015-03-23 ENCOUNTER — Emergency Department (HOSPITAL_COMMUNITY): Payer: Self-pay

## 2015-03-23 MED ORDER — ONDANSETRON HCL 4 MG PO TABS: 4.0000 mg | ORAL_TABLET | Freq: Three times a day (TID) | ORAL | Status: AC | PRN

## 2015-03-23 MED ORDER — KETOROLAC TROMETHAMINE 10 MG PO TABS: 10.0000 mg | ORAL_TABLET | Freq: Four times a day (QID) | ORAL | Status: AC | PRN

## 2015-03-23 NOTE — Discharge Instructions (Signed)
1. Medications: Zofran for nausea as needed, toradol as needed for pain - this medication is best taken with food, continue usual home medications 2. Treatment: rest, drink plenty of fluids 3. Follow Up: Please follow up with the GI clinic and surgeon listed above for discussion of your diagnoses and further evaluation after today's visit; if you do not have a primary care doctor use the resource guide provided to find one; Please return to the ER for any new or worsening symptoms, any additional concerns.

## 2015-03-23 NOTE — ED Notes (Signed)
Pt stable, ambulatory, states understanding of discharge instructions 

## 2016-04-07 ENCOUNTER — Encounter (HOSPITAL_COMMUNITY): Payer: Self-pay | Admitting: Emergency Medicine

## 2016-06-24 ENCOUNTER — Encounter (HOSPITAL_COMMUNITY): Payer: Self-pay | Admitting: Emergency Medicine

## 2016-06-24 ENCOUNTER — Emergency Department (HOSPITAL_COMMUNITY): Payer: Self-pay

## 2016-06-24 ENCOUNTER — Emergency Department (HOSPITAL_COMMUNITY)
Admission: EM | Admit: 2016-06-24 | Discharge: 2016-06-24 | Disposition: A | Discharge status: Home / Self Care | Payer: Self-pay | Attending: Emergency Medicine | Admitting: Emergency Medicine

## 2016-06-24 DIAGNOSIS — F17200 Nicotine dependence, unspecified, uncomplicated: Secondary | ICD-10-CM | POA: Insufficient documentation

## 2016-06-24 DIAGNOSIS — R1031 Right lower quadrant pain: Secondary | ICD-10-CM | POA: Insufficient documentation

## 2016-06-24 LAB — COMPREHENSIVE METABOLIC PANEL
ALK PHOS: 87 U/L (ref 38–126)
ALT: 66 U/L — ABNORMAL HIGH (ref 17–63)
AST: 73 U/L — AB (ref 15–41)
Albumin: 4 g/dL (ref 3.5–5.0)
Anion gap: 9 (ref 5–15)
BUN: 18 mg/dL (ref 6–20)
CO2: 24 mmol/L (ref 22–32)
Calcium: 8.7 mg/dL — ABNORMAL LOW (ref 8.9–10.3)
Chloride: 103 mmol/L (ref 101–111)
Creatinine, Ser: 0.97 mg/dL (ref 0.61–1.24)
GFR calc Af Amer: >60 mL/min (ref >60)
GFR calc non Af Amer: >60 mL/min (ref >60)
GLUCOSE: 138 mg/dL — AB (ref 65–99)
Potassium: 4.7 mmol/L (ref 3.5–5.1)
SODIUM: 136 mmol/L (ref 135–145)
Total Bilirubin: 0.7 mg/dL (ref 0.3–1.2)

## 2016-06-24 LAB — CBC
HCT: 39 % (ref 39.0–52.0)
HEMOGLOBIN: 14 g/dL (ref 13.0–17.0)
MCH: 30 pg (ref 26.0–34.0)
MCHC: 35.9 g/dL (ref 30.0–36.0)
MCV: 83.5 fL (ref 78.0–100.0)
Platelets: 185 10*3/uL (ref 150–400)
RBC: 4.67 MIL/uL (ref 4.22–5.81)
RDW: 13.4 % (ref 11.5–15.5)
WBC: 8 10*3/uL (ref 4.0–10.5)

## 2016-06-24 LAB — URINALYSIS, ROUTINE W REFLEX MICROSCOPIC
Bilirubin Urine: NEGATIVE
Glucose, UA: NEGATIVE mg/dL
Hgb urine dipstick: NEGATIVE
Ketones, ur: NEGATIVE mg/dL
Leukocytes, UA: NEGATIVE
Nitrite: NEGATIVE
Protein, ur: NEGATIVE mg/dL
SPECIFIC GRAVITY, URINE: 1.016 (ref 1.005–1.030)
pH: 8 (ref 5.0–8.0)

## 2016-06-24 LAB — LIPASE, BLOOD: Lipase: 39 U/L (ref 11–51)

## 2016-06-24 MED ORDER — IOPAMIDOL (ISOVUE-300) INJECTION 61%
INTRAVENOUS | Status: AC
  Administered 2016-06-24: 100 mL
  Filled 2016-06-24: qty 100

## 2016-06-24 MED ORDER — KETOROLAC TROMETHAMINE 30 MG/ML IJ SOLN
30.0000 mg | Freq: Once | INTRAMUSCULAR | Status: AC
  Administered 2016-06-24: 30 mg via INTRAVENOUS
  Filled 2016-06-24: qty 1

## 2016-06-24 NOTE — ED Provider Notes (Signed)
[facility]-EMERGENCY DEPT Provider Note   CSN: [id number] Arrival date & time: 06/24/16  4098   By signing my name below, I, Clovis Pu, attest that this documentation has been prepared under the direction and in the presence of Tomasita Crumble, MD  Electronically Signed: Clovis Pu, ED Scribe. 06/24/16. 2:57 AM.   History   Chief Complaint Chief Complaint  Patient presents with  . Abdominal Pain   The history is provided by the patient. No language interpreter was used.   HPI Comments:  Drew Austin is a 31 y.o. male who presents to the Emergency Department complaining of acute onset, gradually worsening, intermittent right sided abdominal pain onset 10 PM yesterday. His pain is worse upon palpation. He also reports 2 episodes of vomiting. No alleviating factors noted. Pt denies fevers, diarrhea, change in appetite, back pain, any recent sick contacts, a hx of abdominal surgeries or any other associated symptoms. No other complaints noted.   History translated by family.  Past Medical History:  Diagnosis Date  . Acid reflux   . Kidney stone     There are no active problems to display for this patient.   History reviewed. No pertinent surgical history.     Home Medications    Prior to Admission medications   Medication Sig Start Date End Date Taking? Authorizing Provider  ketorolac (TORADOL) 10 MG tablet Take 1 tablet (10 mg total) by mouth every 6 (six) hours as needed. Patient taking differently: Take 10 mg by mouth every 6 (six) hours as needed for moderate pain.  03/23/15  Yes Jaime Pilcher Ward, PA-C  methocarbamol (ROBAXIN) 500 MG tablet Take 1 tablet (500 mg total) by mouth 2 (two) times daily as needed for muscle spasms. 12/17/13  Yes Eber Hong, MD  ondansetron (ZOFRAN) 4 MG tablet Take 1 tablet (4 mg total) by mouth every 8 (eight) hours as needed for nausea or vomiting. 03/23/15  Yes Chase Picket Ward, PA-C    Family History No family history on  file.  Social History Social History  Substance Use Topics  . Smoking status: Current Every Day Smoker    Packs/day: 0.25  . Smokeless tobacco: Never Used  . Alcohol use No     Allergies   Patient has no known allergies.   Review of Systems Review of Systems 10 Systems reviewed and are negative for acute change except as noted in the HPI.  Physical Exam Updated Vital Signs BP 117/69   Pulse 79   Temp 98.6 F (37 C) (Oral)   Resp 18   SpO2 98%   Physical Exam  Constitutional: He is oriented to person, place, and time. Vital signs are normal. He appears well-developed and well-nourished.  Non-toxic appearance. He does not appear ill. No distress.  HENT:  Head: Normocephalic and atraumatic.  Nose: Nose normal.  Mouth/Throat: Oropharynx is clear and moist. No oropharyngeal exudate.  Eyes: Conjunctivae and EOM are normal. Pupils are equal, round, and reactive to light. No scleral icterus.  Neck: Normal range of motion. Neck supple. No tracheal deviation, no edema, no erythema and normal range of motion present. No thyroid mass and no thyromegaly present.  Cardiovascular: Normal rate, regular rhythm, S1 normal, S2 normal, normal heart sounds, intact distal pulses and normal pulses.  Exam reveals no gallop and no friction rub.   No murmur heard. Pulmonary/Chest: Effort normal and breath sounds normal. No respiratory distress. He has no wheezes. He has no rhonchi. He has no rales.  Abdominal:  Soft. Normal appearance and bowel sounds are normal. He exhibits no distension, no ascites and no mass. There is no hepatosplenomegaly. There is tenderness in the right lower quadrant. There is no rebound, no guarding and no CVA tenderness.  Mild TTP RLQ No psoas, obturator or rosving's sign    Musculoskeletal: Normal range of motion. He exhibits no edema or tenderness.  Lymphadenopathy:    He has no cervical adenopathy.  Neurological: He is alert and oriented to person, place, and time. He  has normal strength. No cranial nerve deficit or sensory deficit.  Skin: Skin is warm, dry and intact. No petechiae and no rash noted. He is not diaphoretic. No erythema. No pallor.  Nursing note and vitals reviewed.    ED Treatments / Results  DIAGNOSTIC STUDIES:  Oxygen Saturation is 100% on RA, normal by my interpretation.    COORDINATION OF CARE:  2:54 AM Discussed treatment plan with pt at bedside and pt agreed to plan.  Labs (all labs ordered are listed, but only abnormal results are displayed) Labs Reviewed  COMPREHENSIVE METABOLIC PANEL - Abnormal; Notable for the following:       Result Value   Glucose, Bld 138 (*)    Calcium 8.7 (*)    AST 73 (*)    ALT 66 (*)    All other components within normal limits  URINALYSIS, ROUTINE W REFLEX MICROSCOPIC - Abnormal; Notable for the following:    Color, Urine STRAW (*)    All other components within normal limits  LIPASE, BLOOD  CBC    EKG  EKG Interpretation None       Radiology Ct Abdomen Pelvis W Contrast  Result Date: 06/24/2016 CLINICAL DATA:  Initial evaluation for acute right lower quadrant abdominal pain. EXAM: CT ABDOMEN AND PELVIS WITH CONTRAST TECHNIQUE: Multidetector CT imaging of the abdomen and pelvis was performed using the standard protocol following bolus administration of intravenous contrast. CONTRAST:  100mL ISOVUE-300 IOPAMIDOL (ISOVUE-300) INJECTION 61% COMPARISON:  None available. FINDINGS: Lower chest: Mild atelectatic changes present dependently within the visualized lung bases. Visualized lungs are otherwise clear. Hepatobiliary: Diffuse hypoattenuation of liver suggestive of steatosis. Focal fatty sparing about the gallbladder fossa. Liver otherwise unremarkable. Gallbladder normal. No biliary dilatation. Pancreas: Pancreas normal. Spleen: Spleen normal. Adrenals/Urinary Tract: Adrenal glands are normal. Kidneys equal in size with symmetric enhancement. Subcentimeter hypodensity within the  interpolar right kidney noted, too small the characterize, but statistically likely a small cyst. Kidneys otherwise unremarkable without evidence for nephrolithiasis, hydronephrosis, or focal enhancing renal mass. All no hydroureter. Bladder normal. Stomach/Bowel: Stomach within normal limits. No evidence for bowel obstruction. Appendix well visualized within the right lower quadrant and is of normal caliber and appearance without associated inflammatory changes to suggest acute appendicitis. No abnormal wall thickening, mucosal enhancement, or inflammatory fat stranding seen about the bowels. Vascular/Lymphatic: Normal intravascular enhancement seen throughout the intra-abdominal aorta and its branch vessels. No adenopathy. Reproductive: Prostate normal. Other: No free air or fluid. Musculoskeletal: No acute osseous abnormality. No worrisome lytic or blastic osseous lesions. IMPRESSION: 1. No CT evidence for acute intra-abdominal or pelvic process. 2. Normal appendix. 3. Hepatic steatosis. Electronically Signed   By: Rise MuBenjamin  McClintock M.D.   On: 06/24/2016 04:25    Procedures Procedures (including critical care time)  Medications Ordered in ED Medications  ketorolac (TORADOL) 30 MG/ML injection 30 mg (30 mg Intravenous Given 06/24/16 0318)  iopamidol (ISOVUE-300) 61 % injection (100 mLs  Contrast Given 06/24/16 0355)     Initial  Impression / Assessment and Plan / ED Course  I have reviewed the triage vital signs and the nursing notes.  Pertinent labs & imaging results that were available during my care of the patient were reviewed by me and considered in my medical decision making (see chart for details).     Patient presents to the ED for abdominal pain.  History is not entirely consistent with appendicitis, nor are the exam findings.  However patient appears in acute distress and quite comfortable.  Will obtain CT for further evaluation.  Nephrolithiasis is possible as well.  He was given  toradol for pain. Will continue to closely monitor.   Labs and CT neg.  May be gastritis, advised to take ranitidine to see if that helps.  He appears well and in NAD.  Vs remain within his normal limits and he is safe for DC.     Final Clinical Impressions(s) / ED Diagnoses   Final diagnoses:  Right lower quadrant abdominal pain    New Prescriptions New Prescriptions   No medications on file  .   I personally performed the services described in this documentation, which was scribed in my presence. The recorded information has been reviewed and is accurate.       Tomasita Crumble, MD 06/24/16 [id number]

## 2016-06-24 NOTE — ED Triage Notes (Signed)
Pt to ED from home c/o generalized abd pain, nausea, vomiting x 2 since 10pm this evening, states it has gotten worse. Denies fevers/diarrhea.

## 2016-08-23 IMAGING — CT CT RENAL STONE PROTOCOL
2 of 4 series · 12 of 46 positions shown, 14 images · non-contrast
Comparison: None.

CLINICAL DATA: Right-sided flank pain and right lower quadrant pain
since yesterday

EXAM:
CT ABDOMEN AND PELVIS WITHOUT CONTRAST
TECHNIQUE: Multidetector CT imaging of the abdomen and pelvis was performed
following the standard protocol without IV contrast.

[Series 201: stone study, idose (2) · axial · 0.74mm/px · z∈[+97,+522]mm · 9 of 103 slices shown, 11 images]
[im 9/103  soft-tissue]
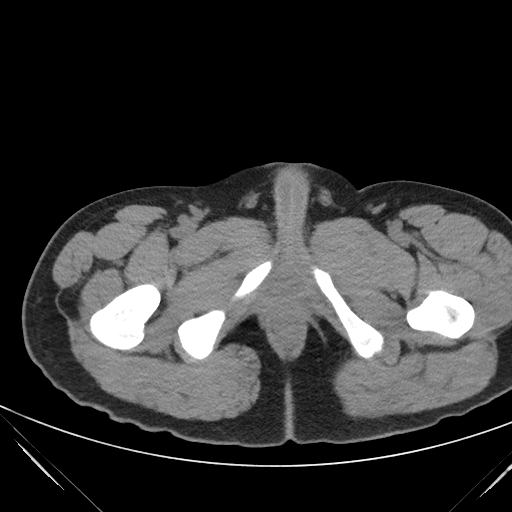
[im 9/103  bone]
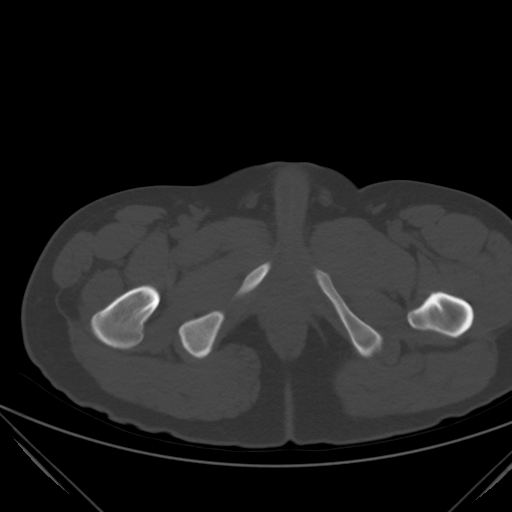
[im 21/103  soft-tissue]
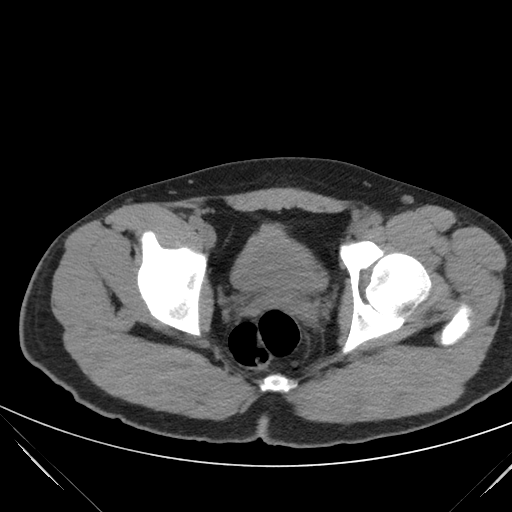
[im 29/103  soft-tissue]
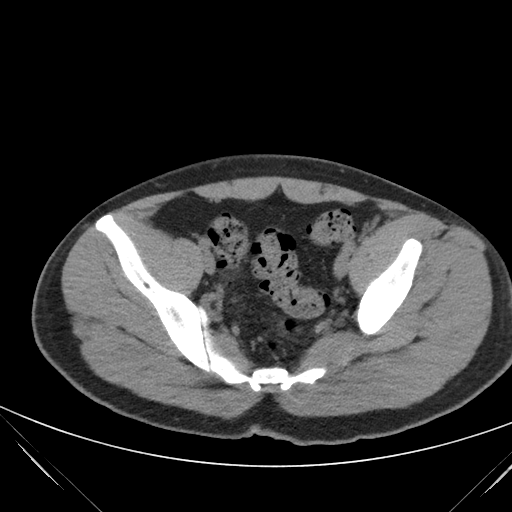
[im 41/103  soft-tissue]
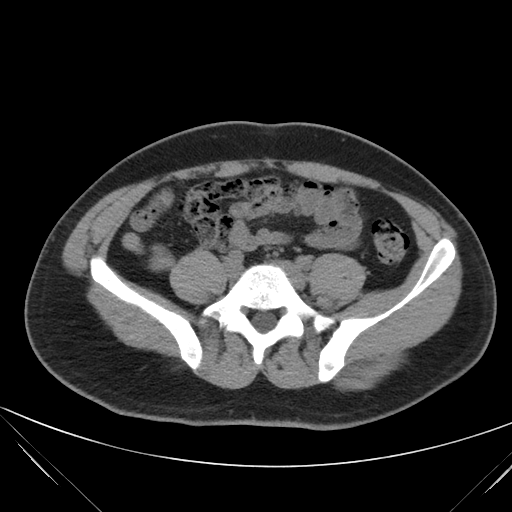
[im 54/103  soft-tissue]
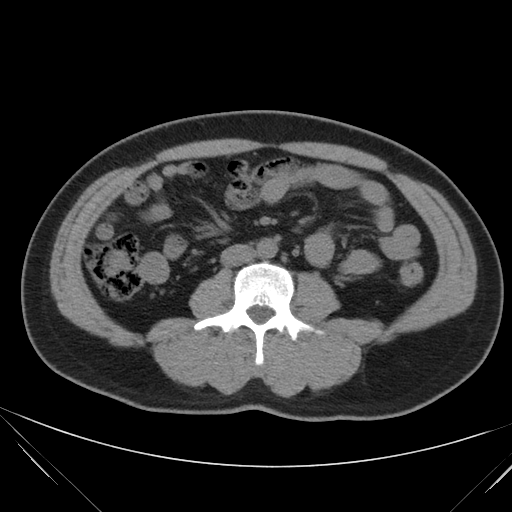
[im 62/103  soft-tissue]
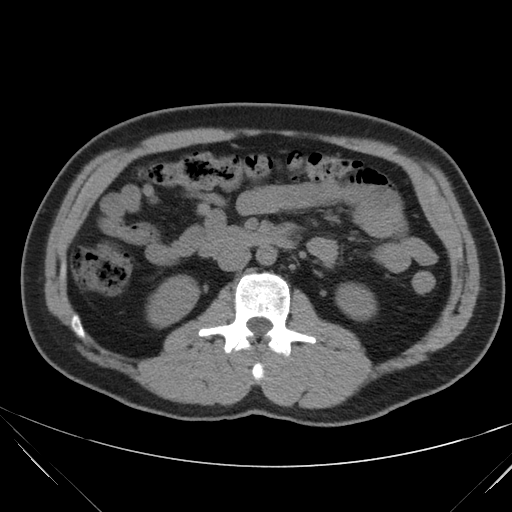
[im 74/103  soft-tissue]
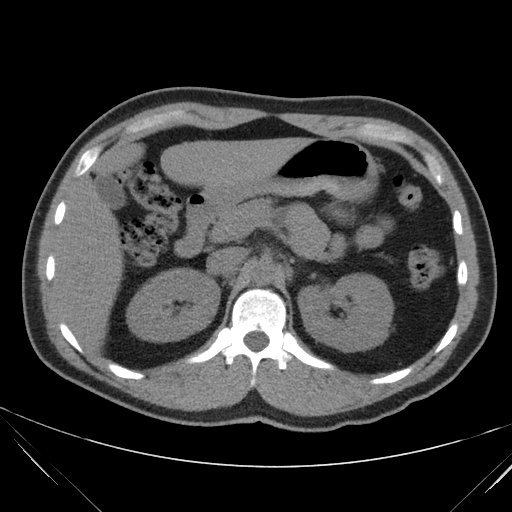
[im 86/103  soft-tissue]
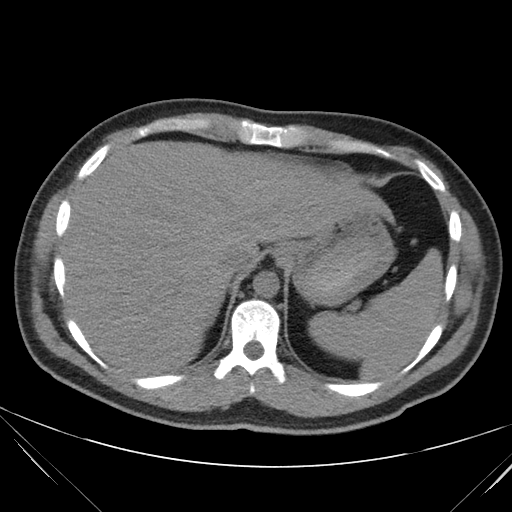
[im 94/103  soft-tissue]
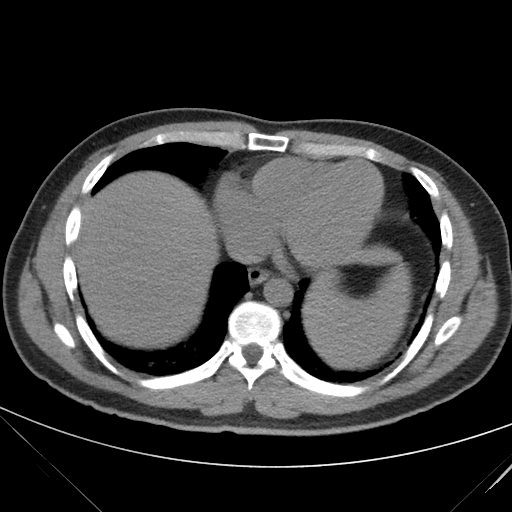
[im 94/103  bone]
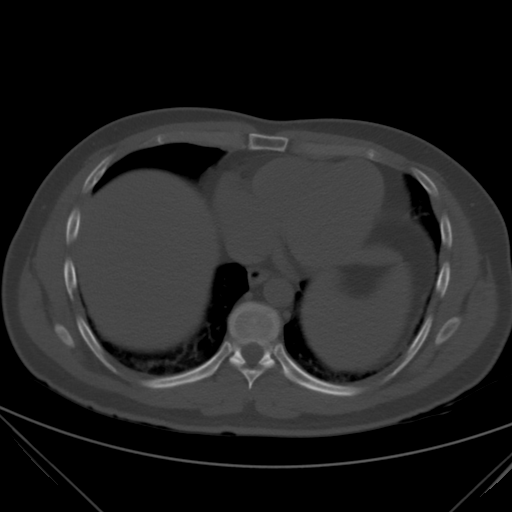

[Series 202: coronals, idose (2) · coronal · 0.50mm/px · 3 of 108 slices shown]
[im 36/108  soft-tissue]
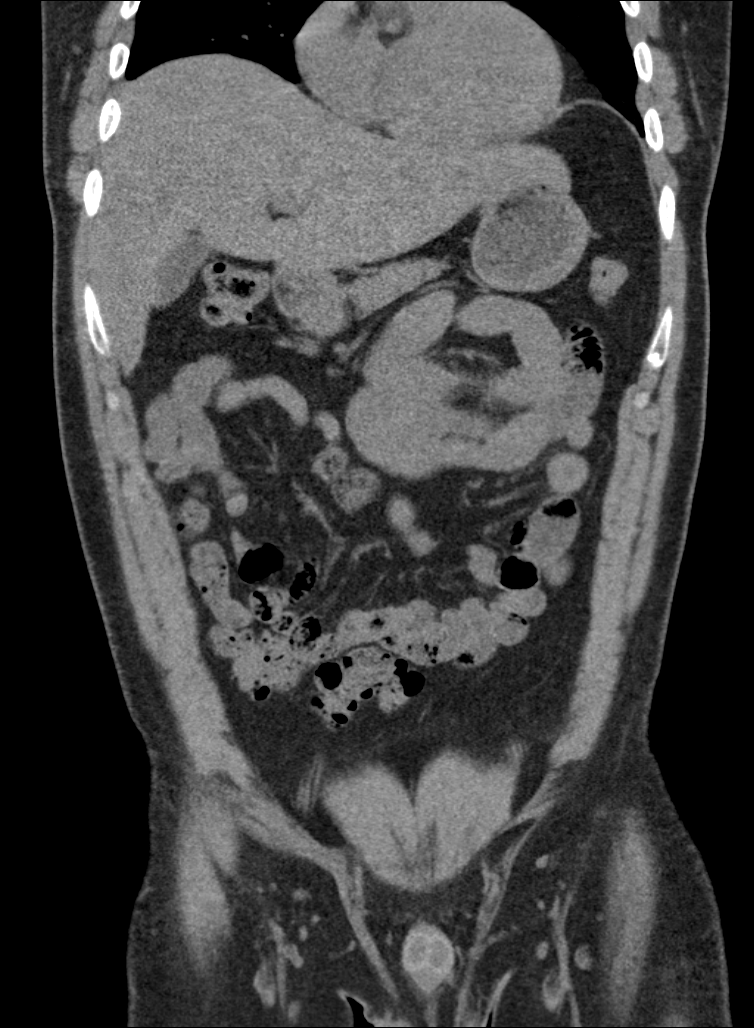
[im 48/108  soft-tissue]
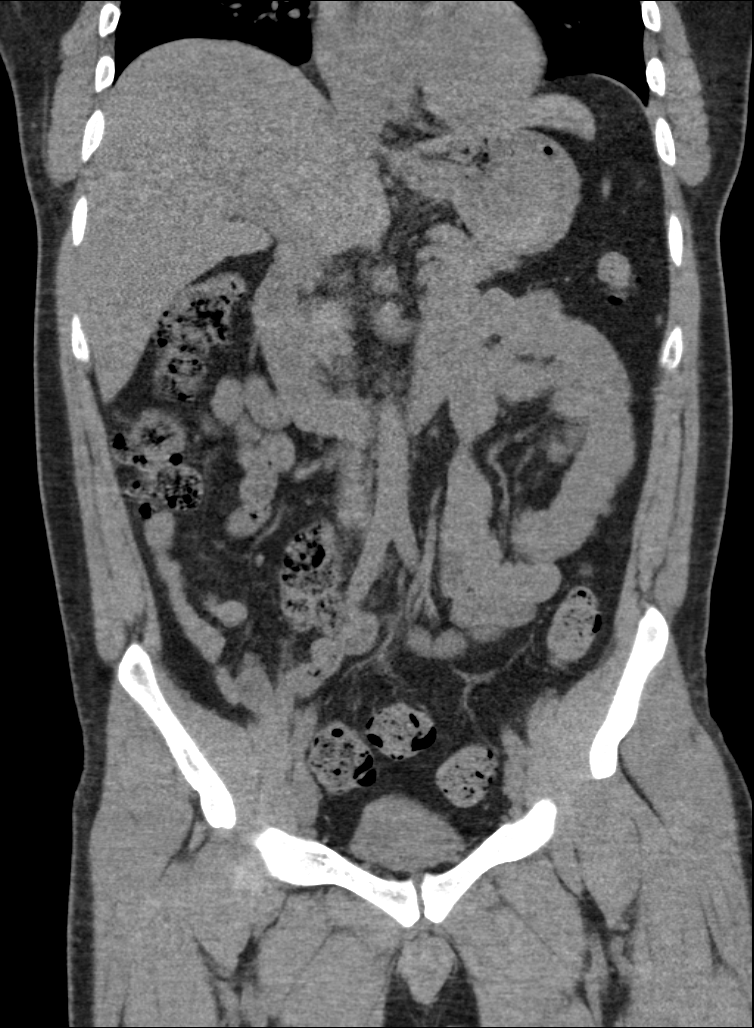
[im 60/108  soft-tissue]
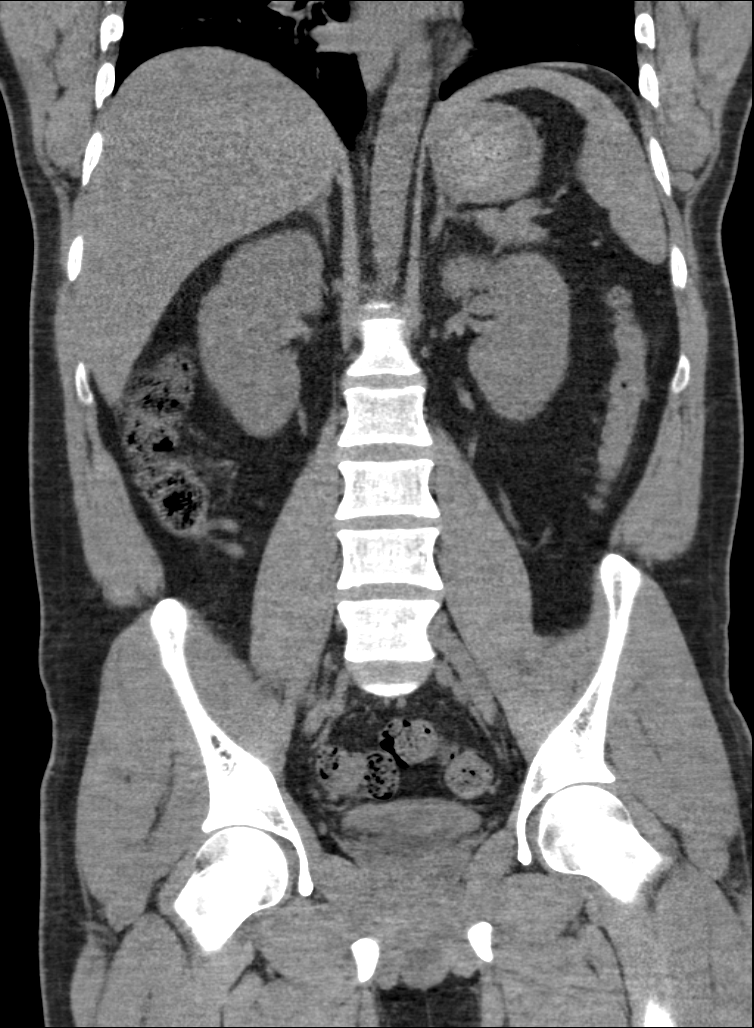

[12 of 46 positions shown; findings below may reference images not displayed]

FINDINGS: BODY WALL: Unremarkable.

LOWER CHEST: Unremarkable.

ABDOMEN/PELVIS:

Liver: No focal abnormality.

Biliary: No evidence of biliary obstruction or stone.

Pancreas: Unremarkable.

Spleen: Unremarkable.

Adrenals: Unremarkable.

Kidneys and ureters: No hydronephrosis or stone.

Bladder: Unremarkable.

Reproductive: Unremarkable.

Bowel: No obstruction. Normal appendix.

Retroperitoneum: Small calcified lymph nodes in the retroperitoneum
or mesentery compatible with remote granulomatous disease.

Peritoneum: No ascites or pneumoperitoneum.

Vascular: No acute abnormality.

OSSEOUS: No acute abnormalities.
IMPRESSION: No explanation for acute pain.  No urolithiasis or appendicitis.

## 2023-06-29 ENCOUNTER — Other Ambulatory Visit: Payer: Self-pay | Admitting: Psychology
